# Patient Record
Sex: Female | Born: 1935 | Race: White | Hispanic: No | Smoking: Never smoker
Health system: Southern US, Community
[De-identification: ages and names within clinical notes are randomized; demographics above are authoritative.]

## PROBLEM LIST (undated history)

## (undated) DIAGNOSIS — I10 Essential (primary) hypertension: Secondary | ICD-10-CM

## (undated) DIAGNOSIS — J189 Pneumonia, unspecified organism: Secondary | ICD-10-CM

## (undated) DIAGNOSIS — E119 Type 2 diabetes mellitus without complications: Secondary | ICD-10-CM

## (undated) DIAGNOSIS — K219 Gastro-esophageal reflux disease without esophagitis: Secondary | ICD-10-CM

## (undated) DIAGNOSIS — M1990 Unspecified osteoarthritis, unspecified site: Secondary | ICD-10-CM

## (undated) HISTORY — PX: ANKLE FRACTURE SURGERY: SHX122

## (undated) HISTORY — DX: Essential (primary) hypertension: I10

## (undated) HISTORY — PX: FOREARM FRACTURE SURGERY: SHX649

## (undated) HISTORY — PX: APPENDECTOMY: SHX54

## (undated) HISTORY — DX: Unspecified osteoarthritis, unspecified site: M19.90

## (undated) HISTORY — DX: Type 2 diabetes mellitus without complications: E11.9

## (undated) HISTORY — PX: KNEE SURGERY: SHX244

## (undated) HISTORY — DX: Gastro-esophageal reflux disease without esophagitis: K21.9

## (undated) HISTORY — PX: CHOLECYSTECTOMY: SHX55

## (undated) HISTORY — DX: Pneumonia, unspecified organism: J18.9

---

## 2018-06-25 ENCOUNTER — Ambulatory Visit (INDEPENDENT_AMBULATORY_CARE_PROVIDER_SITE_OTHER): Payer: Medicare Other | Admitting: Orthopaedic Surgery

## 2018-06-25 ENCOUNTER — Encounter: Payer: Self-pay | Admitting: Orthopaedic Surgery

## 2018-06-25 VITALS — BP 161/74 | HR 99 | Ht 61.0 in | Wt 153.0 lb

## 2018-06-25 DIAGNOSIS — S52572A Other intraarticular fracture of lower end of left radius, initial encounter for closed fracture: Secondary | ICD-10-CM | POA: Diagnosis not present

## 2018-06-25 DIAGNOSIS — S52571A Other intraarticular fracture of lower end of right radius, initial encounter for closed fracture: Secondary | ICD-10-CM | POA: Diagnosis not present

## 2018-06-25 NOTE — Progress Notes (Signed)
Subjective:    Patient ID: Lindsey Navarro, female    DOB: Jan 28, 1934, 84 y.o.   MRN: [id number]  HPI She fell last night on dog urine.  She hurt her left nondominant wrist.  She had pain during the night. She went to the [facility] this morning. X-rays show a fracture comminuted of the distal radius.  The radial styloid is slightly separated but the other fragments are basically intact.  She has some pre-existing arthritis. She has no other injury.  She has no neck pain, back pain or leg pain. She has no redness or numbness.   Review of Systems  Constitutional: Positive for activity change.  Musculoskeletal: Positive for arthralgias and joint swelling.  All other systems reviewed and are negative.  For Review of Systems, all other systems reviewed and are negative.  The following is a summary of the past history medically, past history surgically, known current medicines, social history and family history.  This information is gathered electronically by the computer from prior information and documentation.  I review this each visit and have found including this information at this point in the chart is beneficial and informative.   Past Medical History:  Diagnosis Date  . Arthritis   . Diabetes mellitus (HCC)   . GERD (gastroesophageal reflux disease)   . Hypertension   . Pneumonia     Past Surgical History:  Procedure Laterality Date  . ANKLE FRACTURE SURGERY Left   . APPENDECTOMY    . CHOLECYSTECTOMY    . FOREARM FRACTURE SURGERY Right   . KNEE SURGERY Left     Current Outpatient Medications on File Prior to Visit  Medication Sig Dispense Refill  . amLODipine (NORVASC) 5 MG tablet TAKE 1 TABLET BY MOUTH TWICE A DAY    . atorvastatin (LIPITOR) 40 MG tablet TAKE 1 TAB BY MOUTH EVERY DAY    . metoprolol succinate (TOPROL-XL) 100 MG 24 hr tablet TAKE 1 TABLET BY MOUTH EVERY DAY    . omeprazole (PRILOSEC OTC) 20 MG tablet Take by mouth.    . quinapril (ACCUPRIL) 20  MG tablet Take by mouth.    Marland Kitchen GLIPIZIDE XL 5 MG 24 hr tablet Take 5 mg by mouth daily.  3  . meloxicam (MOBIC) 15 MG tablet meloxicam    . Omega-3-acid Ethyl Esters (LOVAZA PO) Lovaza    . sertraline (ZOLOFT) 50 MG tablet Take 50 mg by mouth daily.  3  . Vitamin D, Ergocalciferol, (DRISDOL) 50000 units CAPS capsule TAKE ONE CAPSULE BY MOUTH ONE TIME PER WEEK  3   No current facility-administered medications on file prior to visit.     Social History   Socioeconomic History  . Marital status: Widowed    Spouse name: Not on file  . Number of children: Not on file  . Years of education: Not on file  . Highest education level: Not on file  Occupational History  . Not on file  Social Needs  . Financial resource strain: Not on file  . Food insecurity:    Worry: Not on file    Inability: Not on file  . Transportation needs:    Medical: Not on file    Non-medical: Not on file  Tobacco Use  . Smoking status: Never Smoker  . Smokeless tobacco: Never Used  Substance and Sexual Activity  . Alcohol use: Not Currently    Frequency: Never  . Drug use: Never  . Sexual activity: Not on file  Lifestyle  .  Physical activity:    Days per week: Not on file    Minutes per session: Not on file  . Stress: Not on file  Relationships  . Social connections:    Talks on phone: Not on file    Gets together: Not on file    Attends religious service: Not on file    Active member of club or organization: Not on file    Attends meetings of clubs or organizations: Not on file    Relationship status: Not on file  . Intimate partner violence:    Fear of current or ex partner: Not on file    Emotionally abused: Not on file    Physically abused: Not on file    Forced sexual activity: Not on file  Other Topics Concern  . Not on file  Social History Narrative  . Not on file    Family History  Problem Relation Age of Onset  . Heart disease Mother   . Heart attack Father   . Stroke Father   .  Cancer Sister   . Cancer Brother     BP (!) 161/74   Pulse 99   Ht 5\' 1"  (1.549 m)   Wt 153 lb (69.4 kg)   BMI 28.91 kg/m   Body mass index is 28.91 kg/m.      Objective:   Physical Exam  Constitutional: She is oriented to person, place, and time. She appears well-developed and well-nourished.  HENT:  Head: Normocephalic and atraumatic.  Eyes: Pupils are equal, round, and reactive to light. Conjunctivae and EOM are normal.  Neck: Normal range of motion. Neck supple.  Cardiovascular: Normal rate, regular rhythm and intact distal pulses.  Pulmonary/Chest: Effort normal.  Abdominal: Soft.  Musculoskeletal:       Left wrist: She exhibits decreased range of motion, tenderness, bony tenderness, swelling and crepitus.       Arms: Neurological: She is alert and oriented to person, place, and time. She has normal reflexes. She displays normal reflexes. No cranial nerve deficit. She exhibits normal muscle tone. Coordination normal.  Skin: Skin is warm and dry.  Psychiatric: She has a normal mood and affect. Her behavior is normal. Judgment and thought content normal.     I have reviewed the X-rays and notes from the Urgent Care.     Assessment & Plan:   Encounter Diagnosis  Name Primary?  . Other closed intra-articular fracture of distal end of right radius, initial encounter Yes   I have explained her fracture to her.  She has a comminuted fracture intra-articular that might "slip" and need surgery.  I will place in a sugar tong splint today.  Return in one week.  Advil for pain.  X-rays on return.  Call if any problem.  Precautions discussed.   Electronically Signed Darreld McleanWayne Ainslie Mazurek, MD 8/27/201911:14 AM

## 2018-07-02 ENCOUNTER — Ambulatory Visit (INDEPENDENT_AMBULATORY_CARE_PROVIDER_SITE_OTHER): Payer: Medicare Other

## 2018-07-02 ENCOUNTER — Ambulatory Visit (INDEPENDENT_AMBULATORY_CARE_PROVIDER_SITE_OTHER): Payer: Medicare Other | Admitting: Orthopaedic Surgery

## 2018-07-02 ENCOUNTER — Encounter: Payer: Self-pay | Admitting: Orthopaedic Surgery

## 2018-07-02 VITALS — Ht 61.0 in | Wt 156.0 lb

## 2018-07-02 DIAGNOSIS — S52572D Other intraarticular fracture of lower end of left radius, subsequent encounter for closed fracture with routine healing: Secondary | ICD-10-CM

## 2018-07-02 NOTE — Progress Notes (Signed)
CC:  My wrist is not hurting as bad  She has done well in the sugar tong cast on the left arm.  She has no new trauma.  NV intact.  Splint OK.  X-rays were done in the splint, reported separately.  Encounter Diagnosis  Name Primary?  . Other closed intra-articular fracture of distal end of left radius with routine healing, subsequent encounter Yes   I will keep her in the splint.  Return in one week.  X-rays then in the splint.  Call if any problem.  Precautions discussed.   Electronically Signed Darreld Mclean, MD 9/3/20198:49 AM

## 2018-07-09 ENCOUNTER — Encounter: Payer: Self-pay | Admitting: Orthopaedic Surgery

## 2018-07-09 ENCOUNTER — Ambulatory Visit (INDEPENDENT_AMBULATORY_CARE_PROVIDER_SITE_OTHER): Payer: Medicare Other

## 2018-07-09 ENCOUNTER — Ambulatory Visit (INDEPENDENT_AMBULATORY_CARE_PROVIDER_SITE_OTHER): Payer: Medicare Other | Admitting: Orthopaedic Surgery

## 2018-07-09 DIAGNOSIS — S52572D Other intraarticular fracture of lower end of left radius, subsequent encounter for closed fracture with routine healing: Secondary | ICD-10-CM | POA: Diagnosis not present

## 2018-07-09 NOTE — Addendum Note (Signed)
Addended byCaffie Damme on: 07/09/2018 10:42 AM   Modules accepted: Orders

## 2018-07-09 NOTE — Progress Notes (Signed)
CC:  I have no problem  She has done well with the sugar tong splint.  She has no new trauma.  NV intact.  The splint is intact.  X-rays were done of the left wrist in the splint, reported separately.  Encounter Diagnosis  Name Primary?  . Other closed intra-articular fracture of distal end of left radius with routine healing, subsequent encounter Yes   She has been placed in a long arm cast today.  Return in two weeks.  X-rays then out of the cast.  Call if any problem.  Precautions discussed.   Electronically Signed Darreld Mclean, MD 9/10/201910:03 AM

## 2018-07-23 ENCOUNTER — Ambulatory Visit (INDEPENDENT_AMBULATORY_CARE_PROVIDER_SITE_OTHER): Payer: Medicare Other

## 2018-07-23 ENCOUNTER — Ambulatory Visit (INDEPENDENT_AMBULATORY_CARE_PROVIDER_SITE_OTHER): Payer: Medicare Other | Admitting: Orthopaedic Surgery

## 2018-07-23 ENCOUNTER — Encounter: Payer: Self-pay | Admitting: Orthopaedic Surgery

## 2018-07-23 VITALS — BP 140/67 | HR 79 | Ht 61.0 in | Wt 156.0 lb

## 2018-07-23 DIAGNOSIS — S52572D Other intraarticular fracture of lower end of left radius, subsequent encounter for closed fracture with routine healing: Secondary | ICD-10-CM

## 2018-07-23 NOTE — Progress Notes (Signed)
CC:  My wrist is sore  She is removed from the cast today.  She has had no problems.  NV intact. ROM of the wrist is tender and decreased.  X-rays were done and reported separately of the left wrist.  Encounter Diagnosis  Name Primary?  . Other closed intra-articular fracture of distal end of left radius with routine healing, subsequent encounter Yes   Begin CUS for the wrist.   Begin exercises.  I have told her how to do them and get some children's Play Dough to work with.  Return in two weeks.  X-rays then of the left wrist.  Call if any problem.  Precautions discussed.   Electronically Signed Darreld McleanWayne Heriberto Stmartin, MD 9/24/20199:46 AM

## 2018-08-06 ENCOUNTER — Encounter: Payer: Self-pay | Admitting: Orthopaedic Surgery

## 2018-08-06 ENCOUNTER — Ambulatory Visit (INDEPENDENT_AMBULATORY_CARE_PROVIDER_SITE_OTHER): Payer: Medicare Other | Admitting: Orthopaedic Surgery

## 2018-08-06 ENCOUNTER — Ambulatory Visit (HOSPITAL_COMMUNITY)
Admission: RE | Admit: 2018-08-06 | Discharge: 2018-08-06 | Disposition: A | Discharge status: Home / Self Care | Payer: Medicare Other | Source: Ambulatory Visit | Attending: Orthopaedic Surgery | Admitting: Orthopaedic Surgery

## 2018-08-06 VITALS — BP 145/69 | HR 77 | Ht 61.0 in | Wt 156.0 lb

## 2018-08-06 DIAGNOSIS — S52572D Other intraarticular fracture of lower end of left radius, subsequent encounter for closed fracture with routine healing: Secondary | ICD-10-CM | POA: Diagnosis not present

## 2018-08-06 NOTE — Progress Notes (Signed)
CC:  My wrist is better  She has been using the CUS.  She has no new trauma.  NV intact.  She has some lack of volar flexion of the left wrist.  I have shown her exercises to do.  X-rays were done of the left wrist at the hospital.  Encounter Diagnosis  Name Primary?  . Other closed intra-articular fracture of distal end of left radius with routine healing, subsequent encounter Yes   Return in three weeks.   X-rays on return.  Call if any problem.  Precautions discussed.   Electronically Signed Darreld Mclean, MD 10/8/201910:30 AM

## 2018-08-27 ENCOUNTER — Ambulatory Visit (INDEPENDENT_AMBULATORY_CARE_PROVIDER_SITE_OTHER): Payer: Medicare Other

## 2018-08-27 ENCOUNTER — Ambulatory Visit (INDEPENDENT_AMBULATORY_CARE_PROVIDER_SITE_OTHER): Payer: Medicare Other | Admitting: Orthopaedic Surgery

## 2018-08-27 ENCOUNTER — Encounter: Payer: Self-pay | Admitting: Orthopaedic Surgery

## 2018-08-27 VITALS — BP 140/71 | HR 80 | Ht 61.0 in | Wt 156.0 lb

## 2018-08-27 DIAGNOSIS — S52572D Other intraarticular fracture of lower end of left radius, subsequent encounter for closed fracture with routine healing: Secondary | ICD-10-CM

## 2018-08-27 NOTE — Progress Notes (Signed)
CC:  My wrist is not hurting, my left shoulder is  She has been using the cock-up splint and doing her exercises.  NV intact. ROM of the left wrist is good.  Her left shoulder is tender but she declines injection or PT or my "looking at it."  X-rays were done of her left wrist, reported separately.  Encounter Diagnosis  Name Primary?  . Other closed intra-articular fracture of distal end of left radius with routine healing, subsequent encounter Yes   Continue her exercises.  She may stop the splint.  Return in two weeks.  No X-rays on return.  Call if any problem.  Precautions discussed.     Electronically Signed Darreld Mclean, MD 10/29/201910:45 AM

## 2018-09-10 ENCOUNTER — Encounter: Payer: Self-pay | Admitting: Orthopaedic Surgery

## 2018-09-10 ENCOUNTER — Ambulatory Visit (INDEPENDENT_AMBULATORY_CARE_PROVIDER_SITE_OTHER): Payer: Medicare Other | Admitting: Orthopaedic Surgery

## 2018-09-10 VITALS — BP 148/68 | HR 80 | Ht 61.0 in | Wt 156.0 lb

## 2018-09-10 DIAGNOSIS — S52572D Other intraarticular fracture of lower end of left radius, subsequent encounter for closed fracture with routine healing: Secondary | ICD-10-CM

## 2018-09-10 NOTE — Progress Notes (Signed)
CC:  My wrist does not hurt  She has been doing her exercises for her left wrist.  She has no pain.  NV intact. ROM is good.  Encounter Diagnosis  Name Primary?  . Other closed intra-articular fracture of distal end of left radius with routine healing, subsequent encounter Yes   I will discharge her.  Return as needed. Call if any problem.  Precautions discussed.   Electronically Signed Darreld Mclean, MD 11/12/20193:00 PM

## 2018-09-17 ENCOUNTER — Ambulatory Visit: Payer: Medicare Other | Admitting: Orthopaedic Surgery

## 2019-08-03 IMAGING — DX DG WRIST COMPLETE 3+V*L*
4 series · 4 of 4 positions shown · non-contrast
Comparison: 07/23/2018

CLINICAL DATA: Left wrist fracture.

EXAM:
LEFT WRIST - COMPLETE 3+ VIEW

[wrist pa]
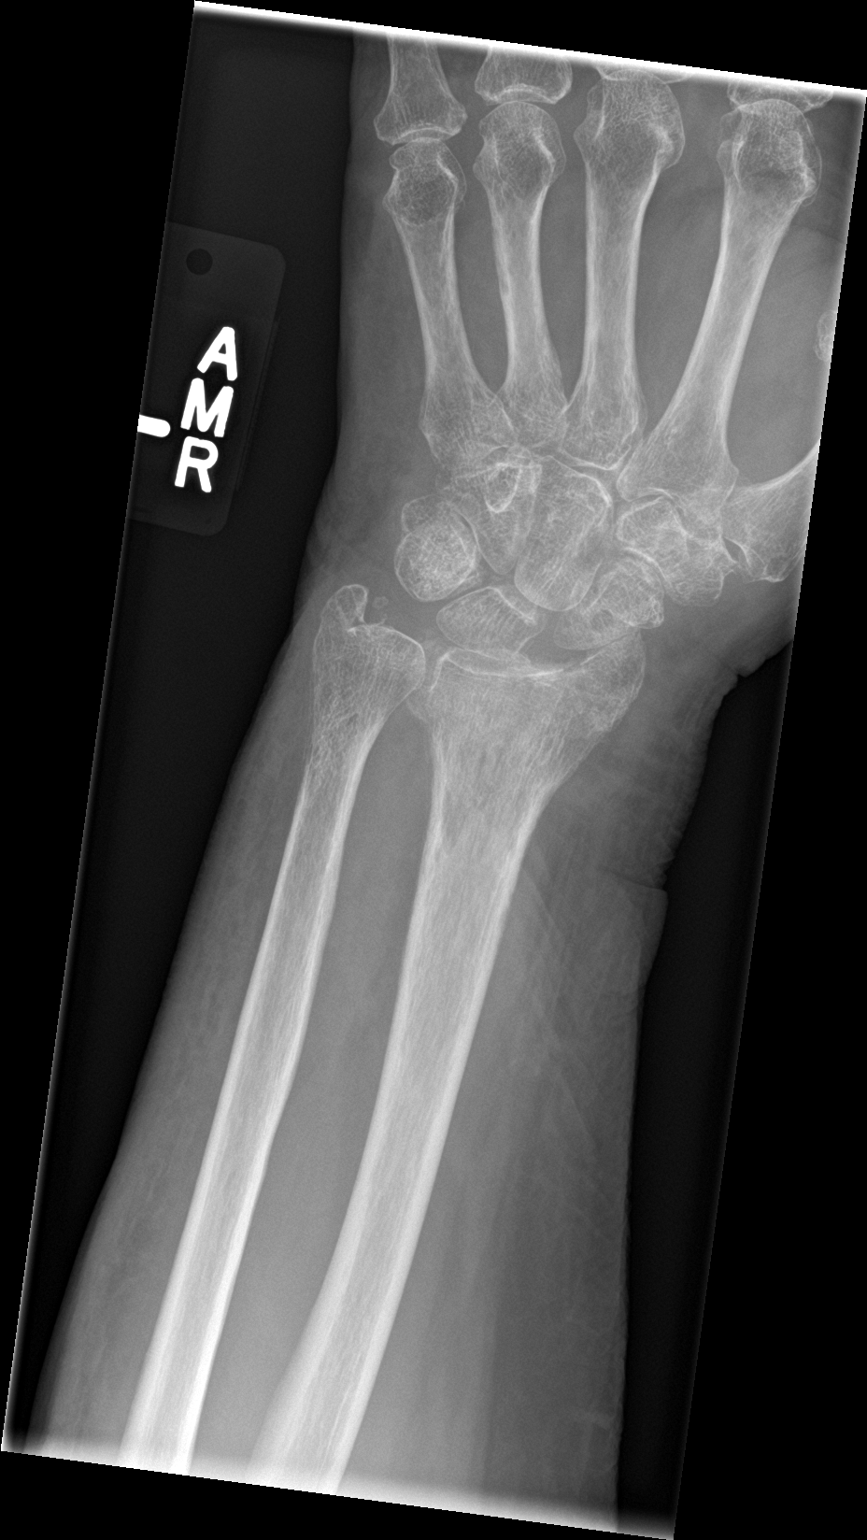

[wrist obl]
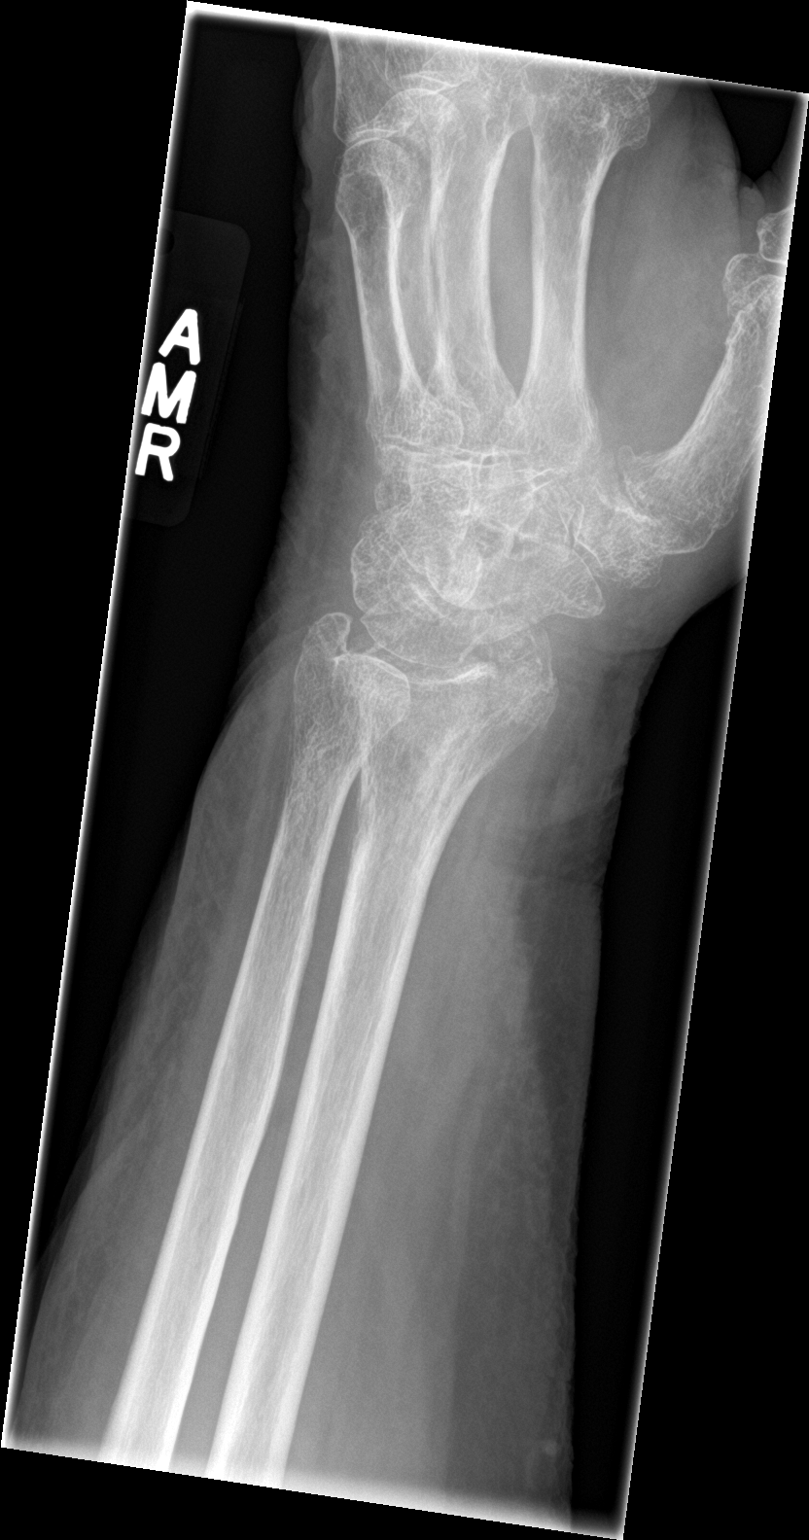

[wrist lat]
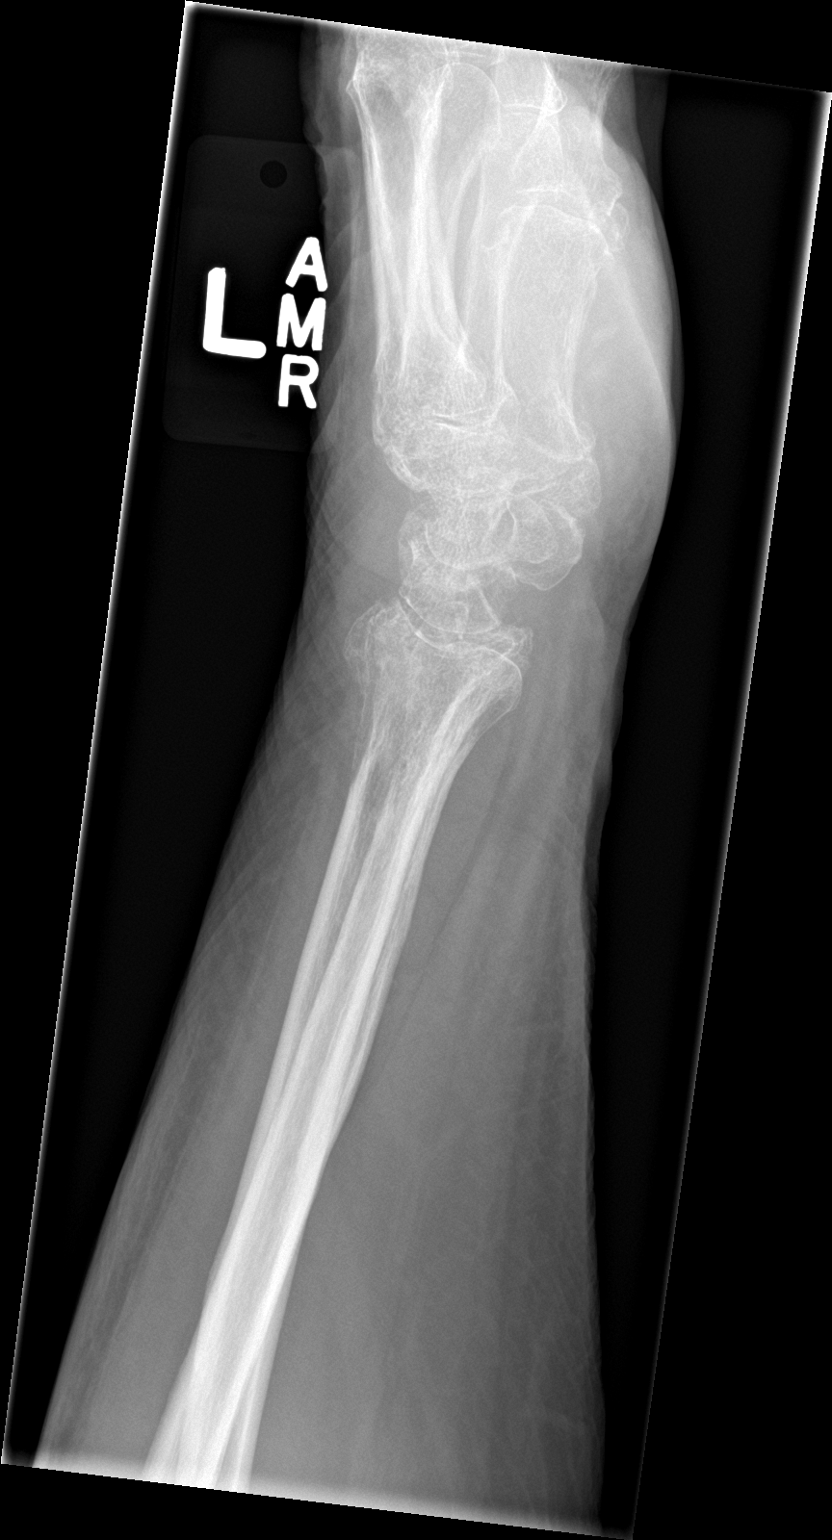

[wrist navicular]
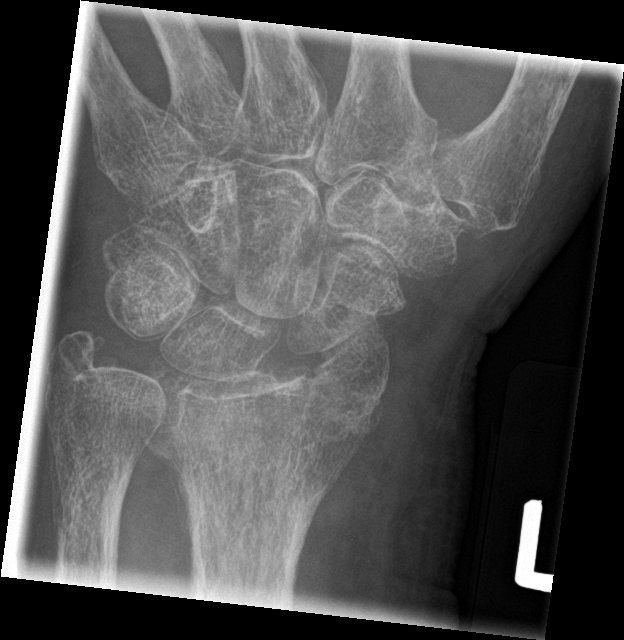

[4 of 4 positions shown; findings below may reference images not displayed]

FINDINGS: Post removal of left forearm cast. Osteopenia. Mild callus formation
around comminuted intra-articular fracture of the distal radius.
Flattening and deformity of the articular surface of the radius at
the radiocarpal joint. Soft tissue swelling.
IMPRESSION: Mild callus formation about comminuted intra-articular fracture of
the distal radius with flattening and deformity of the articular
surface of the radius, similar to the prior radiographs.

## 2020-01-03 ENCOUNTER — Ambulatory Visit: Payer: Medicare Other | Attending: Internal Medicine

## 2020-01-03 DIAGNOSIS — Z23 Encounter for immunization: Secondary | ICD-10-CM | POA: Insufficient documentation

## 2020-01-03 NOTE — Progress Notes (Signed)
   Covid-19 Vaccination Clinic  Name:  Lindsey Navarro    MRN: [id number] DOB: 1933/12/09  01/03/2020  Lindsey Navarro was observed post Covid-19 immunization for 30 minutes based on pre-vaccination screening without incident. She was provided with Vaccine Information Sheet and instruction to access the V-Safe system.   Lindsey Navarro was instructed to call 911 with any severe reactions post vaccine: Marland Kitchen Difficulty breathing  . Swelling of face and throat  . A fast heartbeat  . A bad rash all over body  . Dizziness and weakness   Immunizations Administered    Name Date Dose VIS Date Route   Moderna COVID-19 Vaccine 01/03/2020 11:20 AM 0.5 mL 09/30/2019 Intramuscular   Manufacturer: Moderna   Lot: [id number]   NDC: 77939-030-09

## 2020-02-04 ENCOUNTER — Ambulatory Visit: Payer: Medicare Other | Attending: Internal Medicine

## 2020-02-04 DIAGNOSIS — Z23 Encounter for immunization: Secondary | ICD-10-CM

## 2020-02-04 NOTE — Progress Notes (Signed)
   Covid-19 Vaccination Clinic  Name:  Lindsey Navarro    MRN: [id number] DOB: 24-Feb-1934  02/04/2020  [name] was observed post Covid-19 immunization for 15 minutes without incident. She was provided with Vaccine Information Sheet and instruction to access the V-Safe system.   [name] was instructed to call 911 with any severe reactions post vaccine: Marland Kitchen Difficulty breathing  . Swelling of face and throat  . A fast heartbeat  . A bad rash all over body  . Dizziness and weakness   Immunizations Administered    Name Date Dose VIS Date Route   Moderna COVID-19 Vaccine 02/04/2020  2:40 PM 0.5 mL 09/30/2019 Intramuscular   Manufacturer: Gala Murdoch   Lot: [id number]   NDC: 01027-253-66      Covid-19 Vaccination Clinic  Name:  Lindsey Navarro    MRN: [id number] DOB: [birth year]  02/04/2020  [name] was observed post Covid-19 immunization for 15 minutes without incident. She was provided with Vaccine Information Sheet and instruction to access the V-Safe system.   [name] was instructed to call 911 with any severe reactions post vaccine: Marland Kitchen Difficulty breathing  . Swelling of face and throat  . A fast heartbeat  . A bad rash all over body  . Dizziness and weakness   Immunizations Administered    Name Date Dose VIS Date Route   Moderna COVID-19 Vaccine 02/04/2020  2:40 PM 0.5 mL 09/30/2019 Intramuscular   Manufacturer: Gala Murdoch   Lot: [id number]   NDC: 43329-518-84
